# Patient Record
Sex: Male | Born: 1955 | Race: White | Hispanic: No | Marital: Single
Health system: Southern US, Community
[De-identification: ages and names within clinical notes are randomized; demographics above are authoritative.]

## PROBLEM LIST (undated history)

## (undated) DIAGNOSIS — I10 Essential (primary) hypertension: Secondary | ICD-10-CM

## (undated) DIAGNOSIS — R269 Unspecified abnormalities of gait and mobility: Secondary | ICD-10-CM

## (undated) DIAGNOSIS — F319 Bipolar disorder, unspecified: Secondary | ICD-10-CM

## (undated) DIAGNOSIS — F259 Schizoaffective disorder, unspecified: Secondary | ICD-10-CM

## (undated) DIAGNOSIS — E119 Type 2 diabetes mellitus without complications: Secondary | ICD-10-CM

## (undated) DIAGNOSIS — E785 Hyperlipidemia, unspecified: Secondary | ICD-10-CM

## (undated) DIAGNOSIS — E039 Hypothyroidism, unspecified: Secondary | ICD-10-CM

---

## 2016-02-11 ENCOUNTER — Emergency Department: Payer: Medicare Other

## 2016-02-11 ENCOUNTER — Emergency Department
Admission: EM | Admit: 2016-02-11 | Discharge: 2016-02-13 | Disposition: A | Discharge status: Other Acute Inpatient Hospital | Payer: Medicare Other | Attending: Student in an Organized Health Care Education/Training Program | Admitting: Student in an Organized Health Care Education/Training Program

## 2016-02-11 DIAGNOSIS — I10 Essential (primary) hypertension: Secondary | ICD-10-CM | POA: Insufficient documentation

## 2016-02-11 DIAGNOSIS — F259 Schizoaffective disorder, unspecified: Secondary | ICD-10-CM | POA: Insufficient documentation

## 2016-02-11 DIAGNOSIS — E119 Type 2 diabetes mellitus without complications: Secondary | ICD-10-CM | POA: Insufficient documentation

## 2016-02-11 DIAGNOSIS — Z7984 Long term (current) use of oral hypoglycemic drugs: Secondary | ICD-10-CM | POA: Diagnosis not present

## 2016-02-11 DIAGNOSIS — Z79899 Other long term (current) drug therapy: Secondary | ICD-10-CM | POA: Diagnosis not present

## 2016-02-11 DIAGNOSIS — E039 Hypothyroidism, unspecified: Secondary | ICD-10-CM | POA: Insufficient documentation

## 2016-02-11 DIAGNOSIS — F258 Other schizoaffective disorders: Secondary | ICD-10-CM

## 2016-02-11 DIAGNOSIS — R4182 Altered mental status, unspecified: Secondary | ICD-10-CM | POA: Diagnosis present

## 2016-02-11 HISTORY — DX: Unspecified abnormalities of gait and mobility: R26.9

## 2016-02-11 HISTORY — DX: Schizoaffective disorder, unspecified: F25.9

## 2016-02-11 HISTORY — DX: Type 2 diabetes mellitus without complications: E11.9

## 2016-02-11 HISTORY — DX: Bipolar disorder, unspecified: F31.9

## 2016-02-11 HISTORY — DX: Hypothyroidism, unspecified: E03.9

## 2016-02-11 HISTORY — DX: Hyperlipidemia, unspecified: E78.5

## 2016-02-11 HISTORY — DX: Essential (primary) hypertension: I10

## 2016-02-11 LAB — COMPREHENSIVE METABOLIC PANEL
ALBUMIN: 3.4 g/dL — AB (ref 3.5–5.0)
ALK PHOS: 114 U/L (ref 38–126)
ALT: 12 U/L — AB (ref 17–63)
AST: 28 U/L (ref 15–41)
Anion gap: 6 (ref 5–15)
BILIRUBIN TOTAL: 0.6 mg/dL (ref 0.3–1.2)
BUN: 21 mg/dL — AB (ref 6–20)
CO2: 27 mmol/L (ref 22–32)
CREATININE: 1.24 mg/dL (ref 0.61–1.24)
Calcium: 9.3 mg/dL (ref 8.9–10.3)
Chloride: 94 mmol/L — ABNORMAL LOW (ref 101–111)
GFR calc Af Amer: >60 mL/min (ref >60)
GFR calc non Af Amer: >60 mL/min (ref >60)
GLUCOSE: 112 mg/dL — AB (ref 65–99)
POTASSIUM: 4.9 mmol/L (ref 3.5–5.1)
Sodium: 127 mmol/L — ABNORMAL LOW (ref 135–145)
TOTAL PROTEIN: 8.6 g/dL — AB (ref 6.5–8.1)

## 2016-02-11 LAB — URINALYSIS COMPLETE WITH MICROSCOPIC (ARMC ONLY)
BACTERIA UA: NONE SEEN
Bilirubin Urine: NEGATIVE
GLUCOSE, UA: 50 mg/dL — AB
Ketones, ur: NEGATIVE mg/dL
Leukocytes, UA: NEGATIVE
Nitrite: NEGATIVE
Protein, ur: NEGATIVE mg/dL
Specific Gravity, Urine: 1.005 (ref 1.005–1.030)
pH: 6 (ref 5.0–8.0)

## 2016-02-11 LAB — CBC WITH DIFFERENTIAL/PLATELET
Basophils Absolute: 0 10*3/uL (ref 0–0.1)
Basophils Relative: 1 %
EOS ABS: 0 10*3/uL (ref 0–0.7)
Eosinophils Relative: 1 %
HEMATOCRIT: 33 % — AB (ref 40.0–52.0)
HEMOGLOBIN: 10.8 g/dL — AB (ref 13.0–18.0)
LYMPHS ABS: 0.6 10*3/uL — AB (ref 1.0–3.6)
Lymphocytes Relative: 14 %
MCH: 29.7 pg (ref 26.0–34.0)
MCHC: 32.8 g/dL (ref 32.0–36.0)
MCV: 90.6 fL (ref 80.0–100.0)
MONO ABS: 0.8 10*3/uL (ref 0.2–1.0)
MONOS PCT: 17 %
NEUTROS ABS: 2.9 10*3/uL (ref 1.4–6.5)
NEUTROS PCT: 67 %
Platelets: 165 10*3/uL (ref 150–440)
RBC: 3.64 MIL/uL — ABNORMAL LOW (ref 4.40–5.90)
RDW: 16.6 % — ABNORMAL HIGH (ref 11.5–14.5)
WBC: 4.4 10*3/uL (ref 3.8–10.6)

## 2016-02-11 LAB — VALPROIC ACID LEVEL: VALPROIC ACID LVL: 55 ug/mL (ref 50.0–100.0)

## 2016-02-11 LAB — TSH: TSH: 3.133 u[IU]/mL (ref 0.350–4.500)

## 2016-02-11 LAB — URINE DRUG SCREEN, QUALITATIVE (ARMC ONLY)
AMPHETAMINES, UR SCREEN: NOT DETECTED
Barbiturates, Ur Screen: NOT DETECTED
Benzodiazepine, Ur Scrn: POSITIVE — AB
COCAINE METABOLITE, UR ~~LOC~~: NOT DETECTED
Cannabinoid 50 Ng, Ur ~~LOC~~: NOT DETECTED
MDMA (ECSTASY) UR SCREEN: NOT DETECTED
METHADONE SCREEN, URINE: NOT DETECTED
Opiate, Ur Screen: NOT DETECTED
Phencyclidine (PCP) Ur S: NOT DETECTED
TRICYCLIC, UR SCREEN: NOT DETECTED

## 2016-02-11 LAB — ACETAMINOPHEN LEVEL: Acetaminophen (Tylenol), Serum: <10 ug/mL — ABNORMAL LOW (ref 10–30)

## 2016-02-11 LAB — ETHANOL: Alcohol, Ethyl (B): <5 mg/dL (ref <5)

## 2016-02-11 LAB — SALICYLATE LEVEL: Salicylate Lvl: <7 mg/dL (ref 2.8–30.0)

## 2016-02-11 MED ORDER — LORAZEPAM 2 MG PO TABS
2.0000 mg | ORAL_TABLET | Freq: Once | ORAL | Status: AC
  Administered 2016-02-11: 2 mg via ORAL
  Filled 2016-02-11: qty 1

## 2016-02-11 MED ORDER — DIPHENHYDRAMINE HCL 25 MG PO CAPS
50.0000 mg | ORAL_CAPSULE | Freq: Once | ORAL | Status: AC
  Administered 2016-02-11: 50 mg via ORAL
  Filled 2016-02-11: qty 2

## 2016-02-11 MED ORDER — HALOPERIDOL 5 MG PO TABS
5.0000 mg | ORAL_TABLET | Freq: Once | ORAL | Status: AC
  Administered 2016-02-11: 5 mg via ORAL
  Filled 2016-02-11: qty 1

## 2016-02-11 NOTE — ED Notes (Signed)
Pt given snack and ginger ale 

## 2016-02-11 NOTE — ED Provider Notes (Signed)
[facility] Emergency Department Provider Note    First MD Initiated Contact with Patient 02/11/16 1800     (approximate)  I have reviewed the triage vital signs and the nursing notes.   HISTORY  Chief Complaint Altered Mental Status  Level V caveat:  Agitation, AMS  HPI David Conley is a 60 y.o. male who presents from [place] [facility] care after severe agitation and aggressive behavior towards staff. EMS actually called me earlier asking for additional IM medications to help with his agitation. He currently arrives to the ER very confused but not combative at this time after receiving 15 of Haldol and 2 of Versed. He is denying any pain. States that he has had hallucinations when he was overdosing on LSD, cocaine and heroin. Denies any new medication changes. history limited due to acute agitation.   Past Medical History:  Diagnosis Date  . Bipolar disorder (HCC)   . Diabetes (HCC)   . Gait difficulty   . Hyperlipidemia   . Hypertension    labile  . Hypothyroidism   . Schizoaffective disorder (HCC)     There are no active problems to display for this patient.   No past surgical history on file.  Prior to Admission medications   Medication Sig Start Date End Date Taking? Authorizing Provider  acetaminophen (TYLENOL) 325 MG tablet Take 650 mg by mouth every 4 (four) hours as needed for mild pain.   Yes Historical Provider, MD  alum & mag hydroxide-simeth (MAALOX PLUS) 400-400-40 MG/5ML suspension Take 10 mLs by mouth every 4 (four) hours as needed for indigestion.   Yes Historical Provider, MD  amLODipine (NORVASC) 10 MG tablet Take 10 mg by mouth daily.   Yes Historical Provider, MD  atorvastatin (LIPITOR) 80 MG tablet Take 80 mg by mouth daily.   Yes Historical Provider, MD  clonazePAM (KLONOPIN) 2 MG tablet Take 2 mg by mouth 2 (two) times daily.   Yes Historical Provider, MD  diphenhydrAMINE (BENADRYL) 25 MG tablet Take 25 mg by mouth every 8  (eight) hours as needed for itching.   Yes Historical Provider, MD  divalproex (DEPAKOTE) 500 MG DR tablet Take 500 mg by mouth 3 (three) times daily.   Yes Historical Provider, MD  docusate sodium (COLACE) 100 MG capsule Take 100 mg by mouth daily.    Yes Historical Provider, MD  haloperidol (HALDOL) 10 MG tablet Take 10 mg by mouth every 8 (eight) hours as needed.   Yes Historical Provider, MD  levothyroxine (SYNTHROID, LEVOTHROID) 88 MCG tablet Take 88 mcg by mouth daily before breakfast.   Yes Historical Provider, MD  lisinopril (PRINIVIL,ZESTRIL) 20 MG tablet Take 20 mg by mouth daily.   Yes Historical Provider, MD  magnesium hydroxide (MILK OF MAGNESIA) 400 MG/5ML suspension Take 30 mLs by mouth daily as needed for mild constipation.   Yes Historical Provider, MD  metFORMIN (GLUCOPHAGE) 1000 MG tablet Take 1,000 mg by mouth 2 (two) times daily with a meal.    Yes Historical Provider, MD  nicotine (NICODERM CQ - DOSED IN MG/24 HOURS) 21 mg/24hr patch Place 21 mg onto the skin daily.   Yes Historical Provider, MD  OLANZapine (ZYPREXA) 10 MG tablet Take 10 mg by mouth daily.    Yes Historical Provider, MD  OLANZapine (ZYPREXA) 20 MG tablet Take 20 mg by mouth at bedtime.   Yes Historical Provider, MD  OLANZapine (ZYPREXA) injection Inject 10 mg into the muscle every 6 (six) hours as needed for  agitation.   Yes Historical Provider, MD  PARoxetine (PAXIL) 20 MG tablet Take 20 mg by mouth daily.   Yes Historical Provider, MD  polyethylene glycol (MIRALAX / GLYCOLAX) packet Take 17 g by mouth daily.   Yes Historical Provider, MD    Allergies Ambien [zolpidem tartrate] and Haldol [haloperidol lactate]  No family history on file.  Social History Social History  Substance Use Topics  . Smoking status: Not on file  . Smokeless tobacco: Not on file  . Alcohol use Not on file    Review of Systems Patient denies headaches, rhinorrhea, blurry vision, numbness, shortness of breath, chest pain,  edema, cough, abdominal pain, nausea, vomiting, diarrhea, dysuria, fevers, rashes or hallucinations unless otherwise stated above in HPI. ____________________________________________   PHYSICAL EXAM:  VITAL SIGNS: Vitals:   02/11/16 1803 02/11/16 2227  BP: 127/88 140/90  Pulse: 80 86  Resp: 16 18  Temp: 98.2 F (36.8 C) 97.9 F (36.6 C)    Constitutional: Alert, disheveled, in no acute distress. Eyes: Conjunctivae are normal. PERRL. EOMI. Head: Atraumatic. Nose: No congestion/rhinnorhea. Mouth/Throat: Mucous membranes are moist.  Oropharynx non-erythematous. Neck: No stridor. Painless ROM. No cervical spine tenderness to palpation Hematological/Lymphatic/Immunilogical: No cervical lymphadenopathy. Cardiovascular: Normal rate, regular rhythm. Grossly normal heart sounds.  Good peripheral circulation. Respiratory: Normal respiratory effort.  No retractions. Lungs CTAB. Gastrointestinal: Soft and nontender. No distention. No abdominal bruits. No CVA tenderness. Genitourinary:  Musculoskeletal: No lower extremity tenderness nor edema.  No joint effusions. Neurologic:no facial droop, MAE spontaneously, no sensory deficits, no gross abnormality Skin:  Skin is warm, dry and intact. No rash noted. Psychiatric: disorganized, loose associations  ____________________________________________   LABS (all labs ordered are listed, but only abnormal results are displayed)  Results for orders placed or performed during the hospital encounter of 02/11/16 (from the past 24 hour(s))  Acetaminophen level     Status: Abnormal   Collection Time: 02/11/16  6:48 PM  Result Value Ref Range   Acetaminophen (Tylenol), Serum <10 (L) 10 - 30 ug/mL  Comprehensive metabolic panel     Status: Abnormal   Collection Time: 02/11/16  6:48 PM  Result Value Ref Range   Sodium 127 (L) 135 - 145 mmol/L   Potassium 4.9 3.5 - 5.1 mmol/L   Chloride 94 (L) 101 - 111 mmol/L   CO2 27 22 - 32 mmol/L   Glucose, Bld  112 (H) 65 - 99 mg/dL   BUN 21 (H) 6 - 20 mg/dL   Creatinine, Ser 1.61 0.61 - 1.24 mg/dL   Calcium 9.3 8.9 - 09.6 mg/dL   Total Protein 8.6 (H) 6.5 - 8.1 g/dL   Albumin 3.4 (L) 3.5 - 5.0 g/dL   AST 28 15 - 41 U/L   ALT 12 (L) 17 - 63 U/L   Alkaline Phosphatase 114 38 - 126 U/L   Total Bilirubin 0.6 0.3 - 1.2 mg/dL   GFR calc non Af Amer >60 >60 mL/min   GFR calc Af Amer >60 >60 mL/min   Anion gap 6 5 - 15  Ethanol     Status: None   Collection Time: 02/11/16  6:48 PM  Result Value Ref Range   Alcohol, Ethyl (B) <5 <5 mg/dL  Salicylate level     Status: None   Collection Time: 02/11/16  6:48 PM  Result Value Ref Range   Salicylate Lvl <7.0 2.8 - 30.0 mg/dL  CBC with Differential     Status: Abnormal   Collection Time: 02/11/16  6:48 PM  Result Value Ref Range   WBC 4.4 3.8 - 10.6 K/uL   RBC 3.64 (L) 4.40 - 5.90 MIL/uL   Hemoglobin 10.8 (L) 13.0 - 18.0 g/dL   HCT 16.133.0 (L) 09.640.0 - 04.552.0 %   MCV 90.6 80.0 - 100.0 fL   MCH 29.7 26.0 - 34.0 pg   MCHC 32.8 32.0 - 36.0 g/dL   RDW 40.916.6 (H) 81.111.5 - 91.414.5 %   Platelets 165 150 - 440 K/uL   Neutrophils Relative % 67 %   Neutro Abs 2.9 1.4 - 6.5 K/uL   Lymphocytes Relative 14 %   Lymphs Abs 0.6 (L) 1.0 - 3.6 K/uL   Monocytes Relative 17 %   Monocytes Absolute 0.8 0.2 - 1.0 K/uL   Eosinophils Relative 1 %   Eosinophils Absolute 0.0 0 - 0.7 K/uL   Basophils Relative 1 %   Basophils Absolute 0.0 0 - 0.1 K/uL  TSH     Status: None   Collection Time: 02/11/16  6:48 PM  Result Value Ref Range   TSH 3.133 0.350 - 4.500 uIU/mL  Valproic acid level     Status: None   Collection Time: 02/11/16  7:45 PM  Result Value Ref Range   Valproic Acid Lvl 55 50.0 - 100.0 ug/mL  Urinalysis complete, with microscopic     Status: Abnormal   Collection Time: 02/11/16 10:58 PM  Result Value Ref Range   Color, Urine STRAW (A) YELLOW   APPearance CLEAR (A) CLEAR   Glucose, UA 50 (A) NEGATIVE mg/dL   Bilirubin Urine NEGATIVE NEGATIVE   Ketones, ur  NEGATIVE NEGATIVE mg/dL   Specific Gravity, Urine 1.005 1.005 - 1.030   Hgb urine dipstick 2+ (A) NEGATIVE   pH 6.0 5.0 - 8.0   Protein, ur NEGATIVE NEGATIVE mg/dL   Nitrite NEGATIVE NEGATIVE   Leukocytes, UA NEGATIVE NEGATIVE   RBC / HPF 0-5 0 - 5 RBC/hpf   WBC, UA 0-5 0 - 5 WBC/hpf   Bacteria, UA NONE SEEN NONE SEEN   Squamous Epithelial / LPF 0-5 (A) NONE SEEN  Urine Drug Screen, Qualitative     Status: Abnormal   Collection Time: 02/11/16 10:58 PM  Result Value Ref Range   Tricyclic, Ur Screen NONE DETECTED NONE DETECTED   Amphetamines, Ur Screen NONE DETECTED NONE DETECTED   MDMA (Ecstasy)Ur Screen NONE DETECTED NONE DETECTED   Cocaine Metabolite,Ur [place] NONE DETECTED NONE DETECTED   Opiate, Ur Screen NONE DETECTED NONE DETECTED   Phencyclidine (PCP) Ur S NONE DETECTED NONE DETECTED   Cannabinoid 50 Ng, Ur [place] NONE DETECTED NONE DETECTED   Barbiturates, Ur Screen NONE DETECTED NONE DETECTED   Benzodiazepine, Ur Scrn POSITIVE (A) NONE DETECTED   Methadone Scn, Ur NONE DETECTED NONE DETECTED   ____________________________________________  EKG My review and personal interpretation at Time: 21:51  Indication: med eval  Rate: 90  Rhythm: sinus Axis: normal Other: normal intervals ____________________________________________  RADIOLOGY  I personally reviewed all radiographic images ordered to evaluate for the above acute complaints and reviewed radiology reports and findings.  These findings were personally discussed with the patient.  Please see medical record for radiology report. ____________________________________________   PROCEDURES  Procedure(s) performed: none    Critical Care performed: no ____________________________________________   INITIAL IMPRESSION / ASSESSMENT AND PLAN / ED COURSE  Pertinent labs & imaging results that were available during my care of the patient were reviewed by me and considered in my medical decision making (see chart for  details).  DDX: Psychosis, delirium, medication effect, noncompliance, polysubstance abuse, Si, Hi, depression   David Conley is a 60 y.o. who presents to the ED with for evaluation of aggressions and agitation.  Patient has psych history of schizoaffective do.  Laboratory testing was ordered to evaluation for underlying electrolyte derangement or signs of underlying organic pathology to explain today's presentation.  CT head ordered due to concern for cva vs bleed.  CT with [facility]. Based on history and physical and laboratory evaluation, it appears that the patient's presentation is 2/2 underlying psychiatric disorder and will require further evaluation and management by inpatient psychiatry.  Patient was  made an IVC due to aggression and disorganized thought process.  Disposition pending psychiatric evaluation.   Clinical Course     ____________________________________________   FINAL CLINICAL IMPRESSION(S) / ED DIAGNOSES  Final diagnoses:  Other schizoaffective disorders (HCC)      NEW MEDICATIONS STARTED DURING THIS VISIT:  New Prescriptions   No medications on file     Note:  This document was prepared using Dragon voice recognition software and may include unintentional dictation errors.    Willy Eddy, MD 02/12/16 [phone]

## 2016-02-11 NOTE — ED Triage Notes (Signed)
Pt arrived by EMS from [facility]. Staff called EMS stating the pt was combative and fighting with staff. EMS called for med order. MD Roxan Hockeyobinson ordered 4 of versed and 5 of Haldol. EMS administered only 2 of versed and 5 of haldol.

## 2016-02-11 NOTE — ED Notes (Signed)
Pt currently non combative. Pt states he doesn't know why he's here.

## 2016-02-12 MED ORDER — ZIPRASIDONE MESYLATE 20 MG IM SOLR
20.0000 mg | Freq: Once | INTRAMUSCULAR | Status: AC
  Administered 2016-02-13: 20 mg via INTRAMUSCULAR
  Filled 2016-02-12: qty 20

## 2016-02-12 NOTE — ED Notes (Signed)
Pt eating breakfast at this time.  

## 2016-02-12 NOTE — ED Notes (Signed)
Pt up to the toilet, pt states that he was able to have a good BM and his belly feels better, no distress noted, pt states that he is going to bed, cont to monitor

## 2016-02-12 NOTE — ED Notes (Signed)
Pt ambulatory to bathroom
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
## 2016-02-12 NOTE — ED Notes (Signed)
Informed by Octavio GravesBonita pt accepted at [facility] [place], called for transport at 1642.  Informed pt would not be transported until first thing in morning 10/23

## 2016-02-12 NOTE — ED Notes (Signed)
Pt escorted to bathroom assisted by this nurse.

## 2016-02-12 NOTE — ED Provider Notes (Signed)
-----------------------------------------   7:23 AM on 02/12/2016 -----------------------------------------   Blood pressure (!) 130/98, pulse 87, temperature 98.3 F (36.8 C), temperature source Oral, resp. rate 16, height 5\' 9"  (1.753 m), weight 81.6 kg, SpO2 99 %.  The patient had no acute events since last update.  Calm and cooperative at this time.  Disposition is pending Psychiatry/Behavioral Medicine team recommendations.     Loleta Roseory Christana Angelica, MD 02/12/16 [phone]
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
## 2016-02-12 NOTE — ED Notes (Signed)
Pt ambulatory to the bathroom assisted by officer

## 2016-02-12 NOTE — BHH Counselor (Addendum)
Recd phone call (15:37) from [facility] [place] (Jorja Loaim) stating they would accept patient for treatment, accepting Dr. Edilia Boobbie Adams... Please call report to [phone]   Writer provided above information to Nurse CDW CorporationDenia   Called [facility] spoke with Georgiann HahnKat @ 17:44 to notify that pt would be arriving in AM on 10/23

## 2016-02-12 NOTE — ED Notes (Signed)
Pt ambulatory to bathroom with nurse assist. 

## 2016-02-12 NOTE — BH Assessment (Signed)
Referral information for Geriatric Placement have been faxed to;    [facility] [phone]) [phone] ext. [phone] [phone] [phone]),    [facility] [phone]),    [facility] [phone] or [phone]),    [facility] [phone]),    [facility] [phone])   [facility] [phone]),    [facility] [phone] or [phone]),    [facility] [phone]),    [facility] [phone]).

## 2016-02-12 NOTE — Progress Notes (Signed)
LCSW spoke David Conley [phone] Further clarification was provided and this worker explained patient is being stabilized and may go to a geri psychiatric hospital ( approx 1-2 weeks and will not be discharging from her facility). Ms [name] apologized for misunderstanding and patient is able to return.  Delta Air LinesClaudine Tenleigh Byer LCSW [phone]

## 2016-02-12 NOTE — ED Notes (Signed)
Pt eating lunch tray at this moment

## 2016-02-13 DIAGNOSIS — F259 Schizoaffective disorder, unspecified: Secondary | ICD-10-CM | POA: Diagnosis not present

## 2016-02-13 NOTE — ED Notes (Signed)
Pt's lights turned down, pt lying in the stretcher, continuing to talk to his self in a quieter tone, cont to monitor

## 2016-02-13 NOTE — ED Notes (Signed)
Pt starting to get loud and yell out, pt stating that he "sees snow on the news! It's on the news! I see it on the fu*&^ng news" pt's tv was on and turned off to decrease agitation, pt is squatting in the stretcher nearly standing as he is yelling, officers also to bedside to keep pt safe and to assist in calming patient.

## 2016-02-13 NOTE — ED Notes (Signed)
Pt on the toilet at this time, states he wanted to be left alone, no distress noted, cont to monitor

## 2016-02-13 NOTE — ED Notes (Signed)
Belongings sent with [facility]

## 2016-02-13 NOTE — ED Provider Notes (Signed)
-----------------------------------------   7:45 AM on 02/13/2016 -----------------------------------------   Blood pressure 137/79, pulse 80, temperature 98.7 F (37.1 C), temperature source Oral, resp. rate 18, height 5\' 9"  (1.753 m), weight 180 lb (81.6 kg), SpO2 95 %.  The patient had no acute events since last update.  Calm and cooperative at this time.  [facility]. Patient received geodon overnight    Rebecka ApleyAllison P Makylie Rivere, MD 02/13/16 [phone]

## 2016-02-13 NOTE — ED Provider Notes (Signed)
-----------------------------------------   8:26 AM on 02/13/2016 -----------------------------------------  [facility]. We're currently arranging transportation for the patient.   Minna AntisKevin Larnell Granlund, MD 02/13/16 [phone]

## 2016-05-01 ENCOUNTER — Other Ambulatory Visit: Payer: Medicare Other

## 2016-08-21 DEATH — deceased

## 2017-09-20 IMAGING — CT CT HEAD W/O CM
3 of 6 series · 13 of 47 positions shown, 15 images · non-contrast
Comparison: None.

CLINICAL DATA: Acute encephalopathy.  Patient combative.

EXAM:
CT HEAD WITHOUT CONTRAST
TECHNIQUE: Contiguous axial images were obtained from the base of the skull
through the vertex without intravenous contrast.

[Series 2: head wo · axial · 0.47mm/px · z∈[-176,-61]mm · 8 of 29 slices shown, 10 images]
[im 3/29  brain]
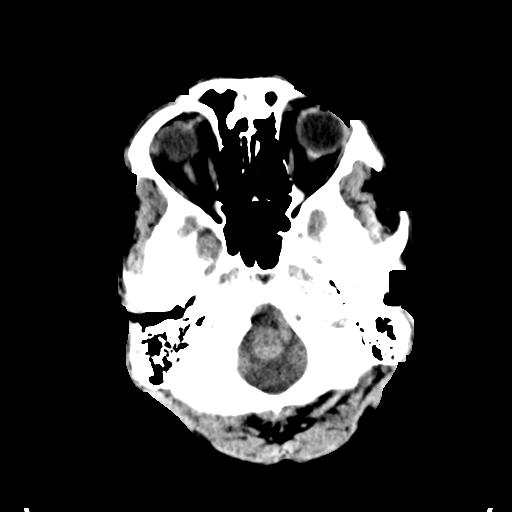
[im 3/29  bone]
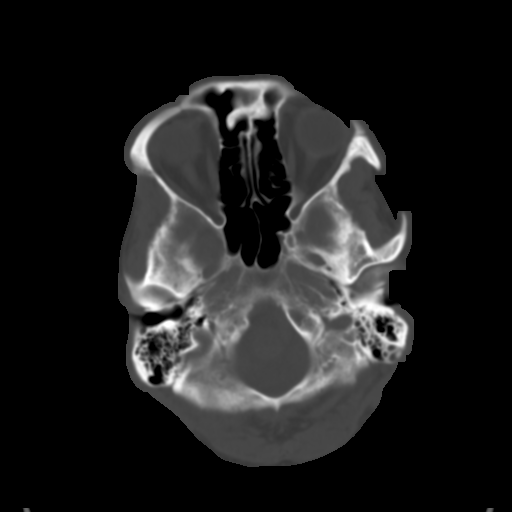
[im 6/29  brain]
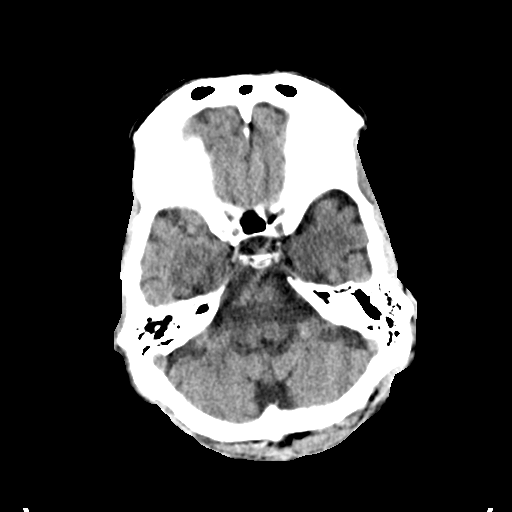
[im 9/29  brain]
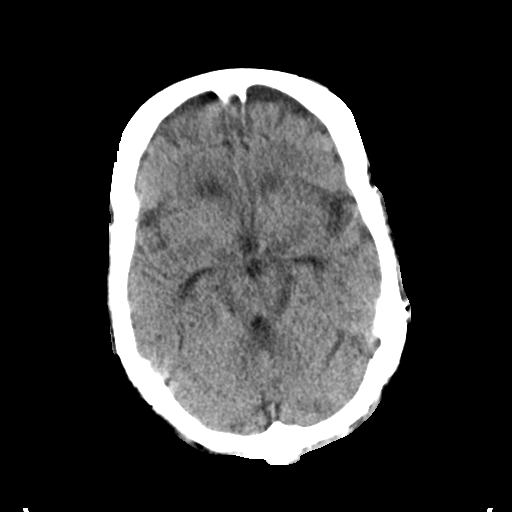
[im 12/29  brain]
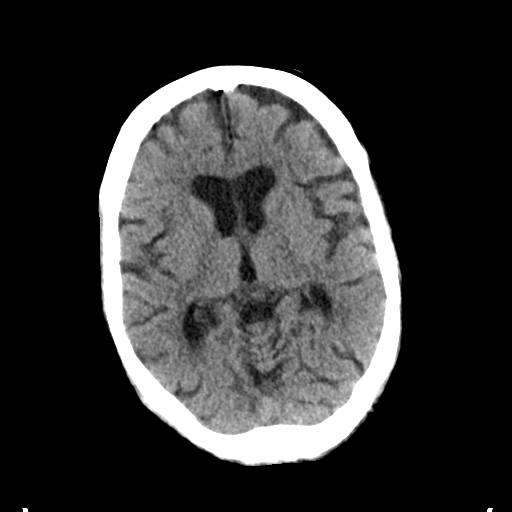
[im 17/29  brain]
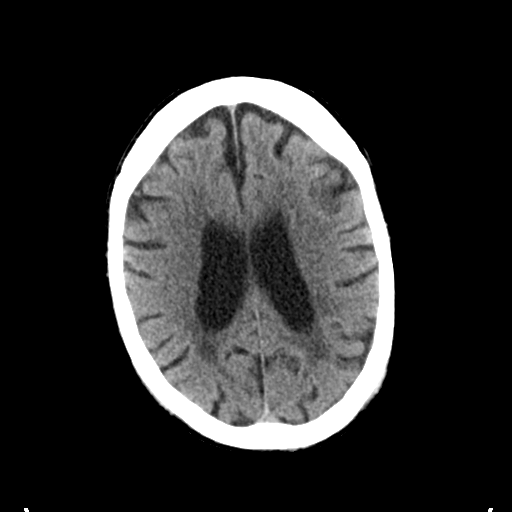
[im 17/29  bone]
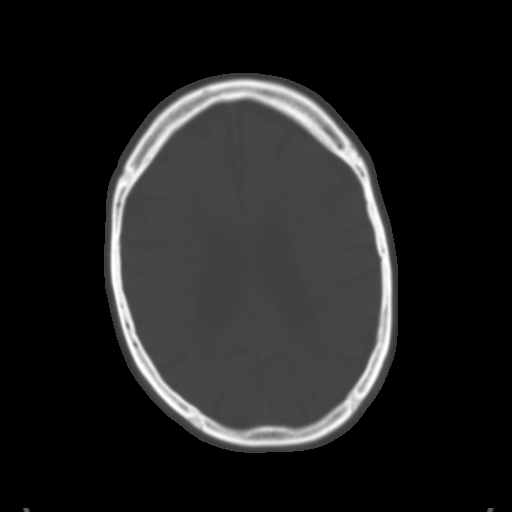
[im 20/29  brain]
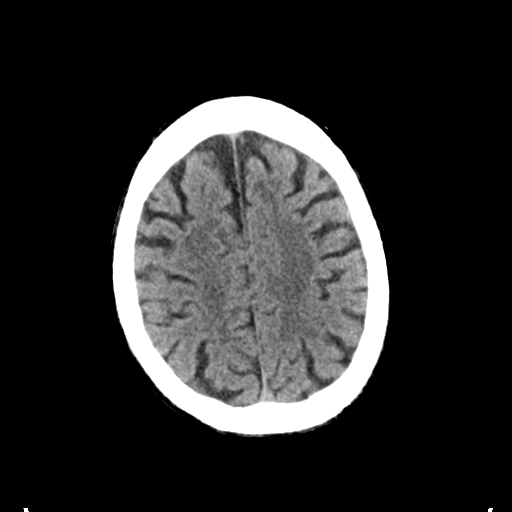
[im 23/29  brain]
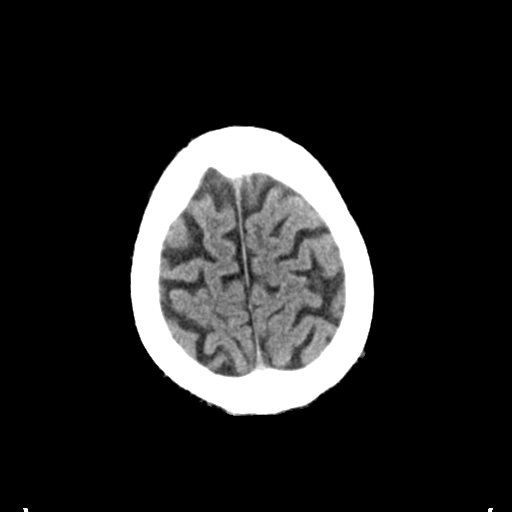
[im 26/29  brain]
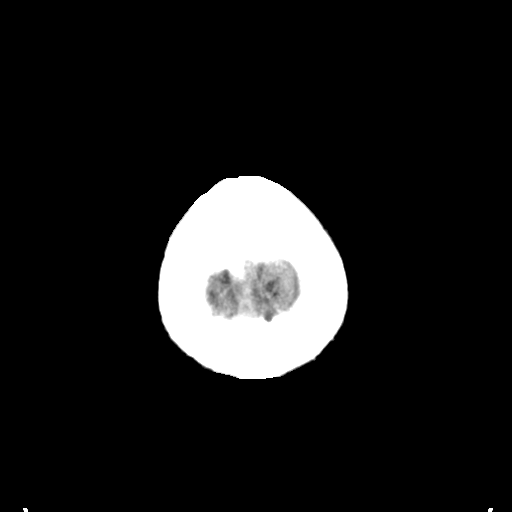

[Series 4: coronal soft tissue · coronal · 0.29mm/px · 3 of 61 slices shown]
[im 16/61  brain]
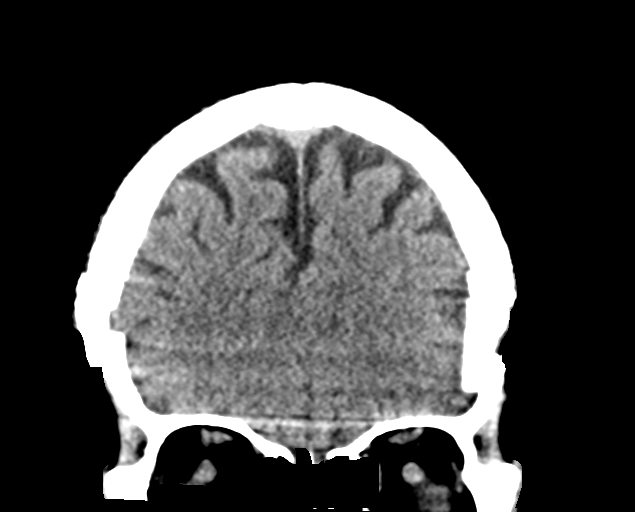
[im 31/61  brain]
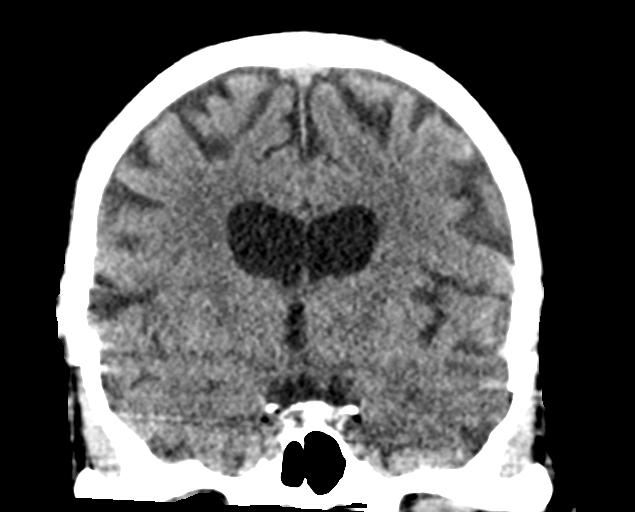
[im 46/61  brain]
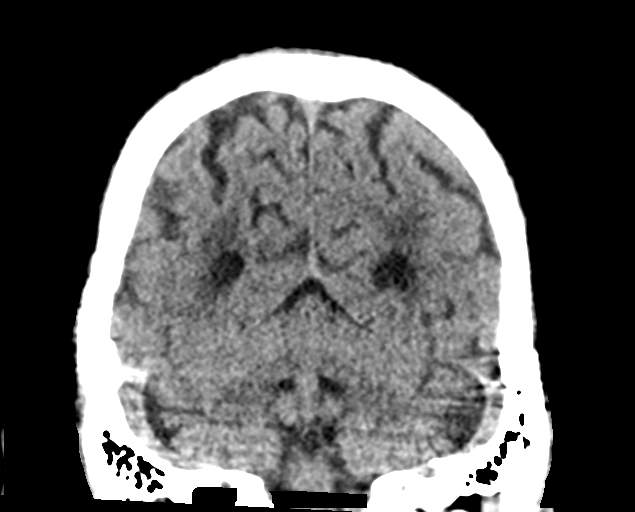

[Series 9: sagittal soft tissue · sagittal · 0.15mm/px · 2 of 50 slices shown]
[im 17/50  brain]
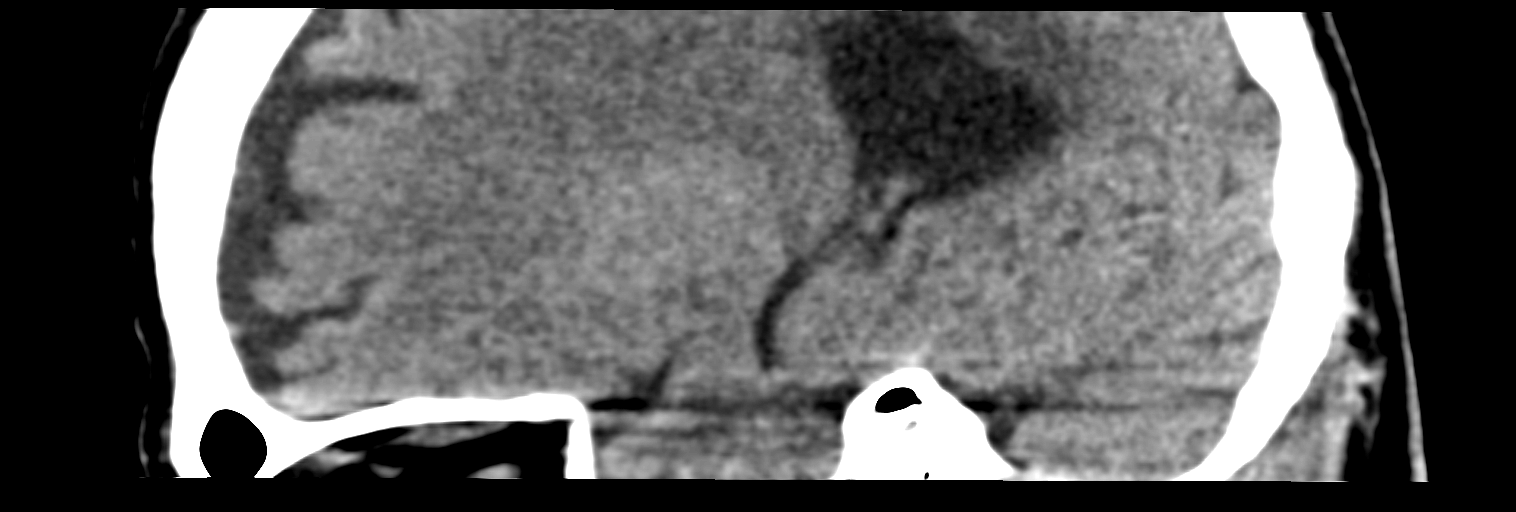
[im 33/50  brain]
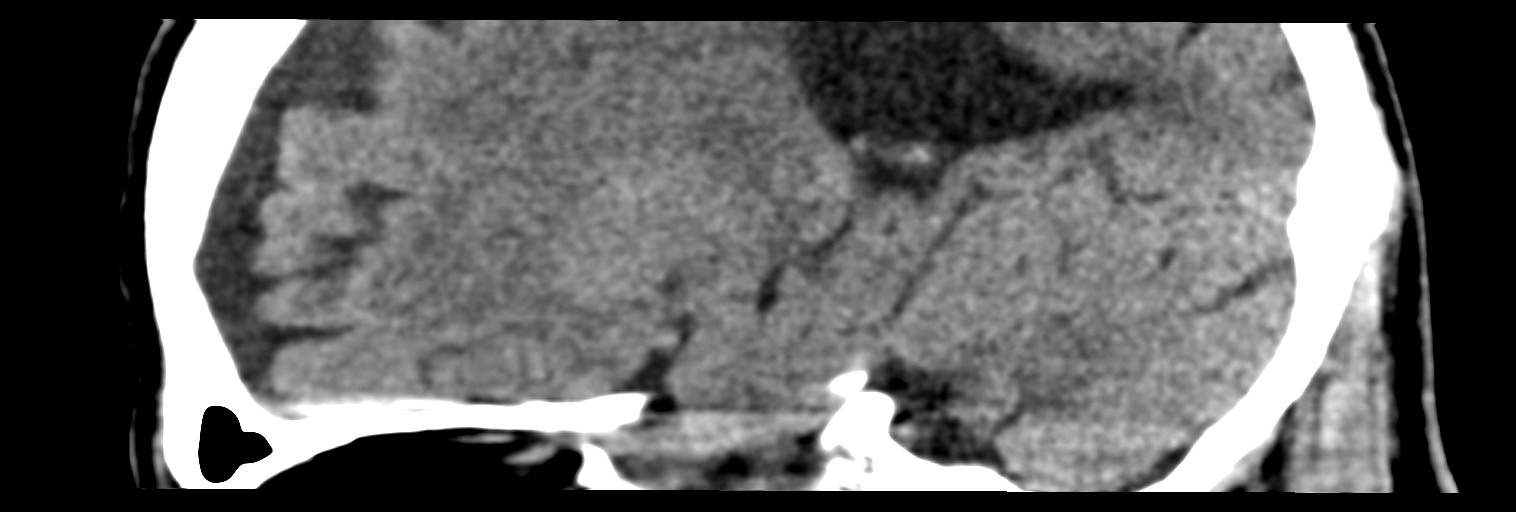

[13 of 47 positions shown; findings below may reference images not displayed]

FINDINGS: Brain: Ventricles, cisterns and other CSF spaces are within normal.
There is no mass, mass effect, shift of midline structures or acute
hemorrhage. No evidence of acute infarction. Minimal chronic
ischemic microvascular disease.

Vascular: Within normal.

Skull: Within normal.

Sinuses/Orbits: Within normal.
IMPRESSION: No acute intracranial findings.

Mild chronic ischemic microvascular disease.
